# Patient Record
Sex: Female | Born: 1975 | Race: White | Hispanic: No | Marital: Single | Smoking: Never smoker
Health system: Southern US, Community
[De-identification: ages and names within clinical notes are randomized; demographics above are authoritative.]

## PROBLEM LIST (undated history)

## (undated) DIAGNOSIS — I10 Essential (primary) hypertension: Secondary | ICD-10-CM

## (undated) DIAGNOSIS — E7800 Pure hypercholesterolemia, unspecified: Secondary | ICD-10-CM

## (undated) DIAGNOSIS — R079 Chest pain, unspecified: Secondary | ICD-10-CM

## (undated) HISTORY — DX: Essential (primary) hypertension: I10

## (undated) HISTORY — PX: NO PAST SURGERIES: SHX2092

## (undated) HISTORY — DX: Pure hypercholesterolemia, unspecified: E78.00

## (undated) HISTORY — DX: Chest pain, unspecified: R07.9

---

## 2002-07-10 ENCOUNTER — Other Ambulatory Visit: Admission: RE | Admit: 2002-07-10 | Discharge: 2002-07-10 | Payer: Self-pay | Admitting: Family Medicine

## 2004-09-07 ENCOUNTER — Ambulatory Visit: Payer: Self-pay | Admitting: Family Medicine

## 2004-09-16 ENCOUNTER — Other Ambulatory Visit: Admission: RE | Admit: 2004-09-16 | Discharge: 2004-09-16 | Payer: Self-pay | Admitting: Family Medicine

## 2004-09-16 ENCOUNTER — Ambulatory Visit: Payer: Self-pay | Admitting: Family Medicine

## 2010-03-20 ENCOUNTER — Inpatient Hospital Stay (HOSPITAL_COMMUNITY): Payer: Managed Care, Other (non HMO)

## 2010-03-20 ENCOUNTER — Inpatient Hospital Stay (HOSPITAL_COMMUNITY)
Admission: AD | Admit: 2010-03-20 | Discharge: 2010-03-20 | Disposition: A | Discharge status: Home / Self Care | Payer: Managed Care, Other (non HMO) | Source: Ambulatory Visit | Attending: Obstetrics & Gynecology | Admitting: Obstetrics & Gynecology

## 2010-03-20 DIAGNOSIS — R109 Unspecified abdominal pain: Secondary | ICD-10-CM

## 2010-03-20 DIAGNOSIS — O200 Threatened abortion: Secondary | ICD-10-CM | POA: Insufficient documentation

## 2010-03-20 LAB — URINALYSIS, ROUTINE W REFLEX MICROSCOPIC
Bilirubin Urine: NEGATIVE
Leukocytes, UA: NEGATIVE
Nitrite: NEGATIVE
Specific Gravity, Urine: 1.01 (ref 1.005–1.030)
pH: 7 (ref 5.0–8.0)

## 2010-03-20 LAB — CBC
HCT: 41.4 % (ref 36.0–46.0)
Hemoglobin: 14.5 g/dL (ref 12.0–15.0)
MCH: 31 pg (ref 26.0–34.0)
MCHC: 35 g/dL (ref 30.0–36.0)
MCV: 88.7 fL (ref 78.0–100.0)

## 2010-03-20 LAB — URINE MICROSCOPIC-ADD ON

## 2010-03-20 LAB — POCT PREGNANCY, URINE: Preg Test, Ur: POSITIVE

## 2010-03-20 IMAGING — US US OB < 14 WEEKS - US OB TV
1 series · 14 of 28 positions shown · non-contrast
Comparison: None

CLINICAL DATA: 6 weeks 4 days pregnant by last menstrual period
with bleeding.  Beta HCG level 387.

OBSTETRIC <14 WK US AND TRANSVAGINAL OB US
TECHNIQUE: Both transabdominal and transvaginal ultrasound
examinations were performed for complete evaluation of the
gestation as well as the maternal uterus, adnexal regions, and
pelvic cul-de-sac.

[Series 1: us ob < 14 weeks - us ob tv · 14 of 41 slices shown]
[im 2/41]
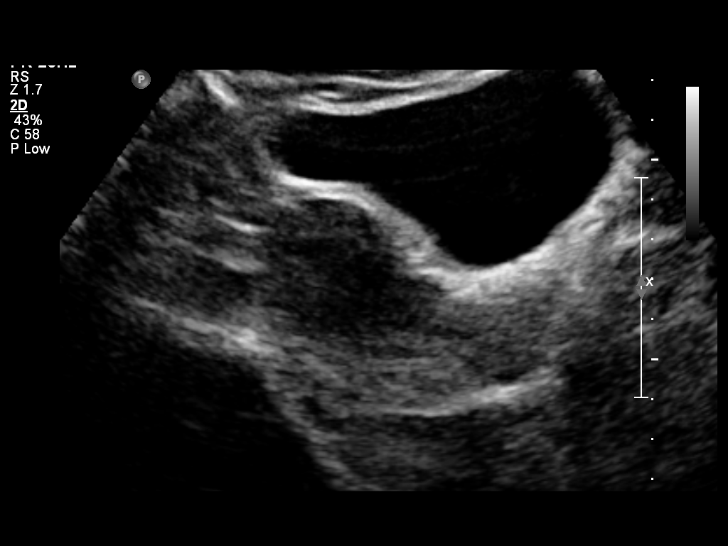
[im 5/41]
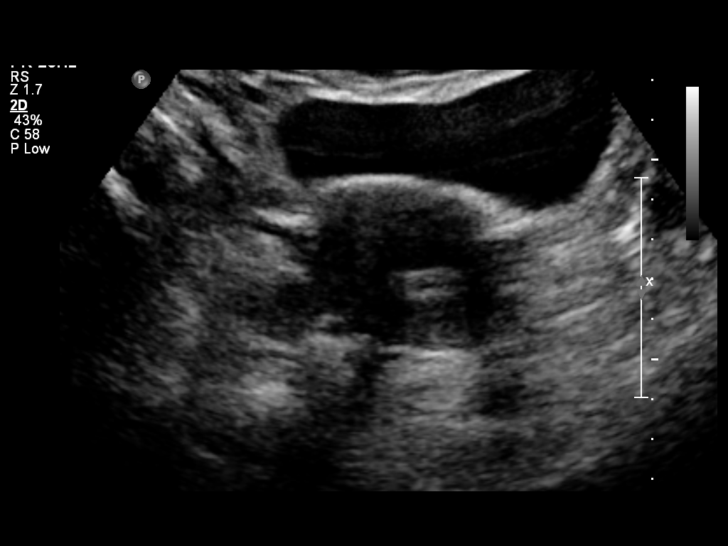
[im 8/41]
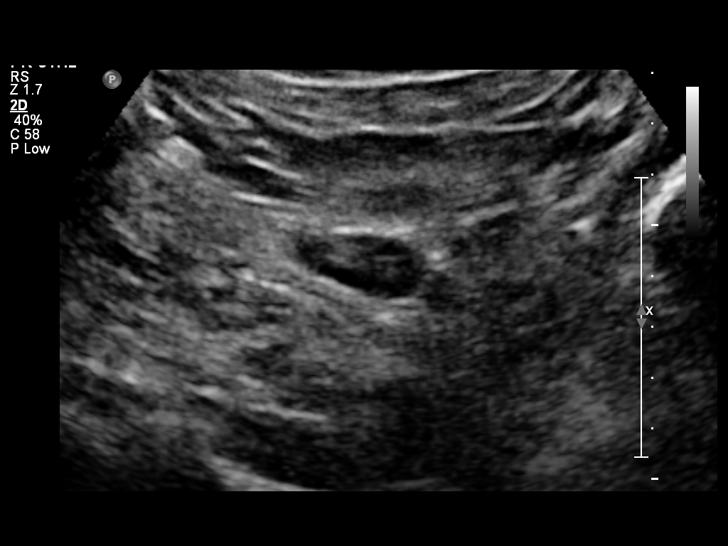
[im 11/41]
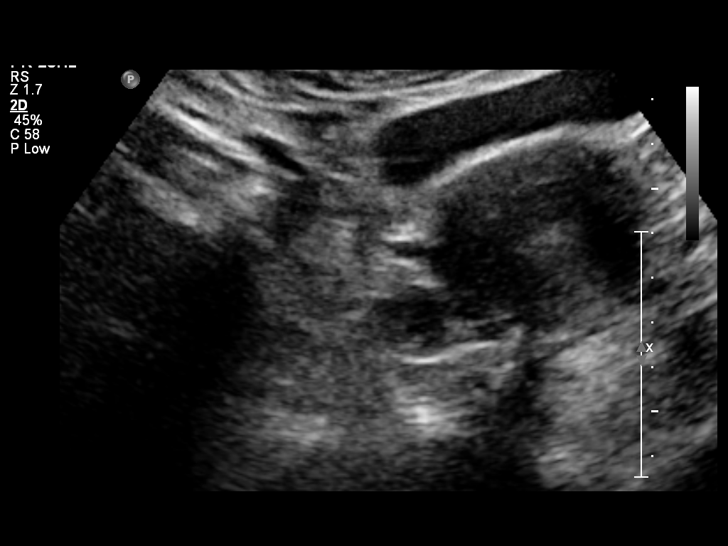
[im 14/41]
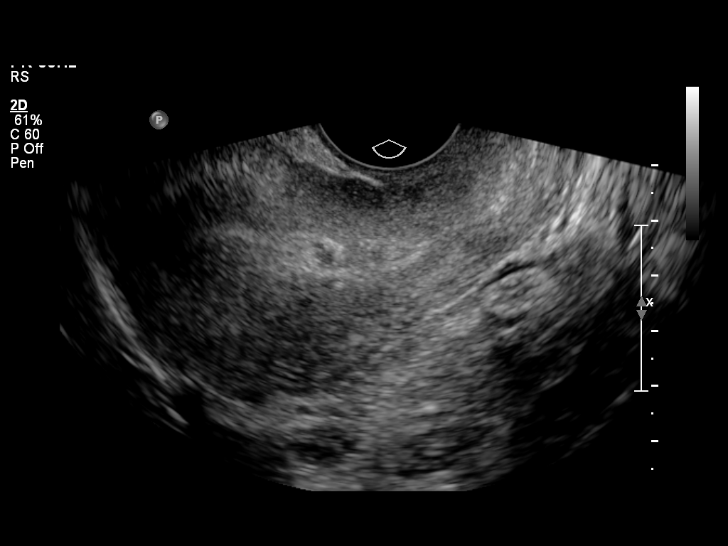
[im 17/41]
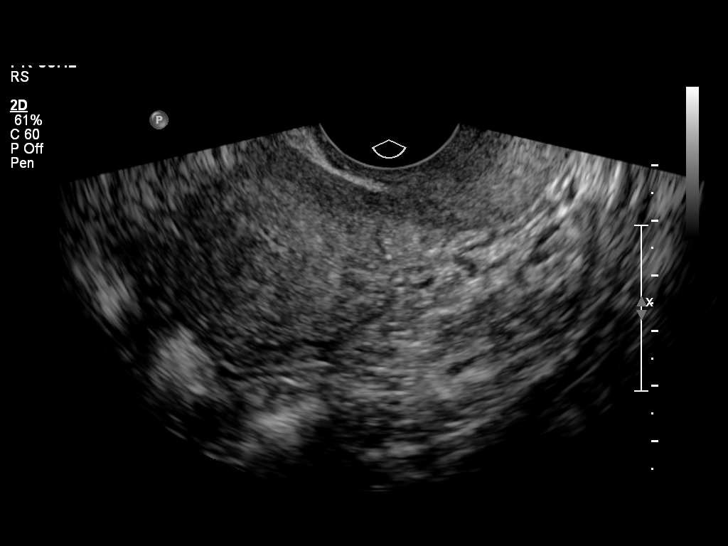
[im 20/41]
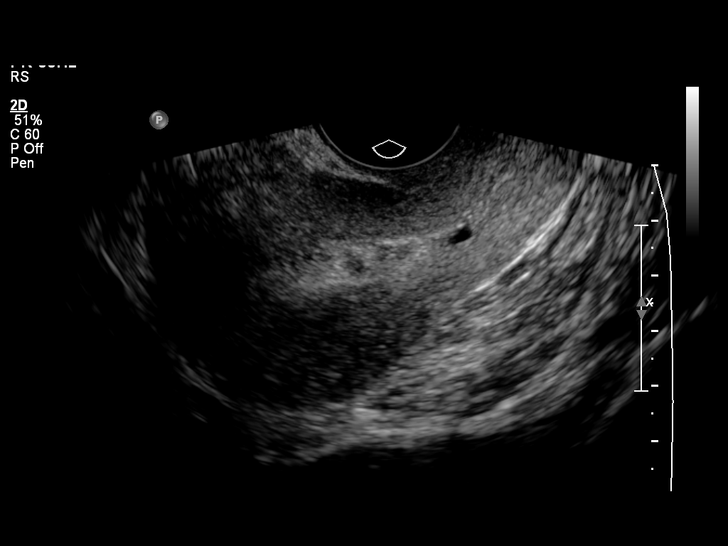
[im 23/41]
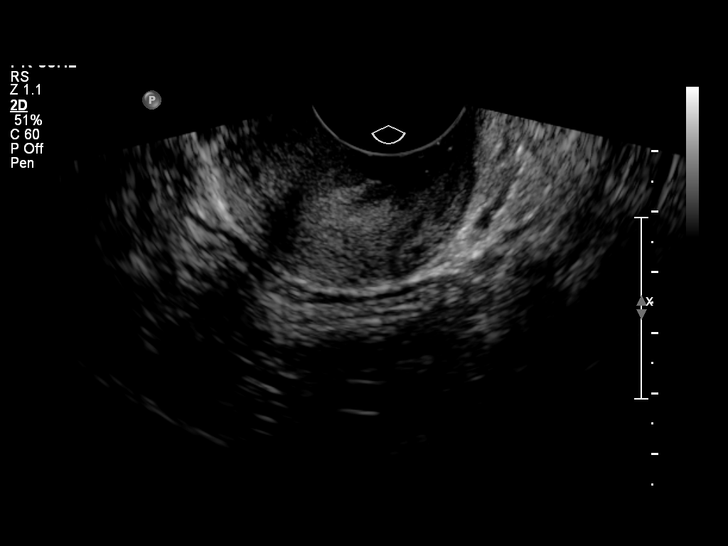
[im 26/41]
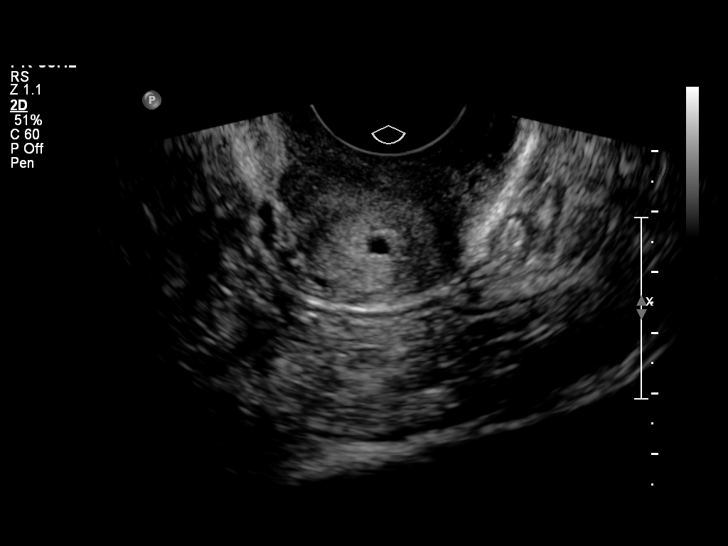
[im 29/41]
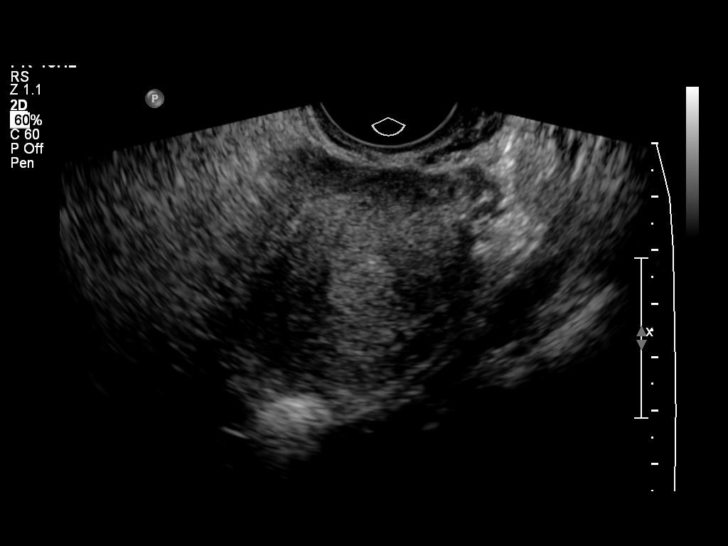
[im 32/41]
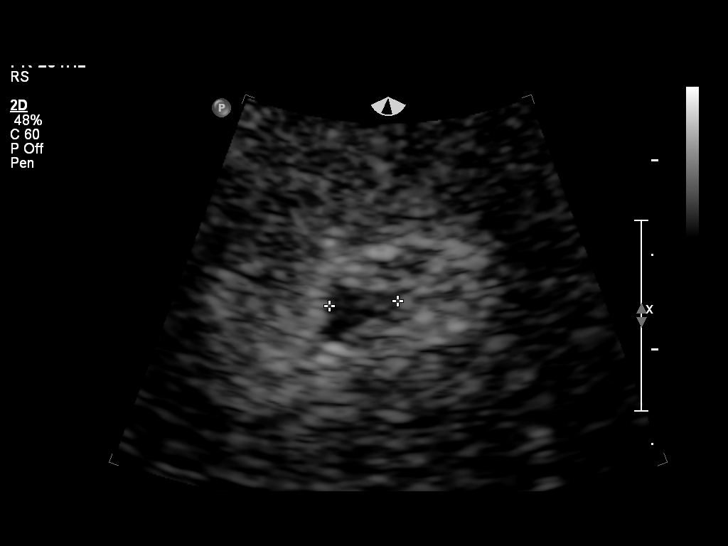
[im 35/41]
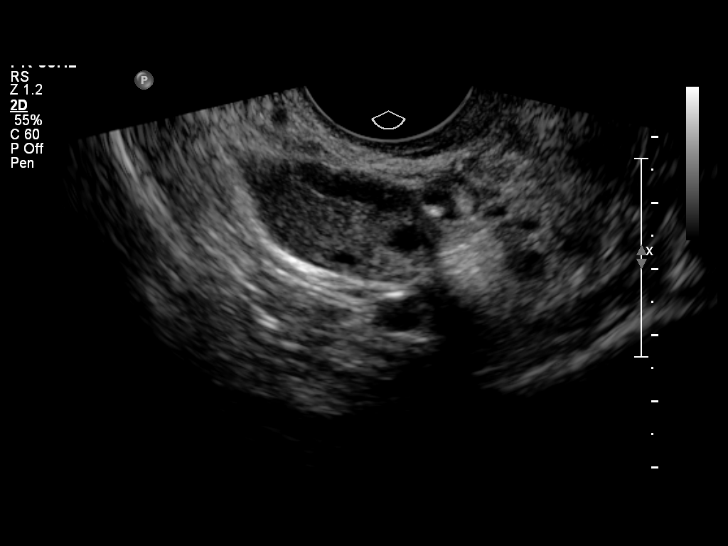
[im 38/41]
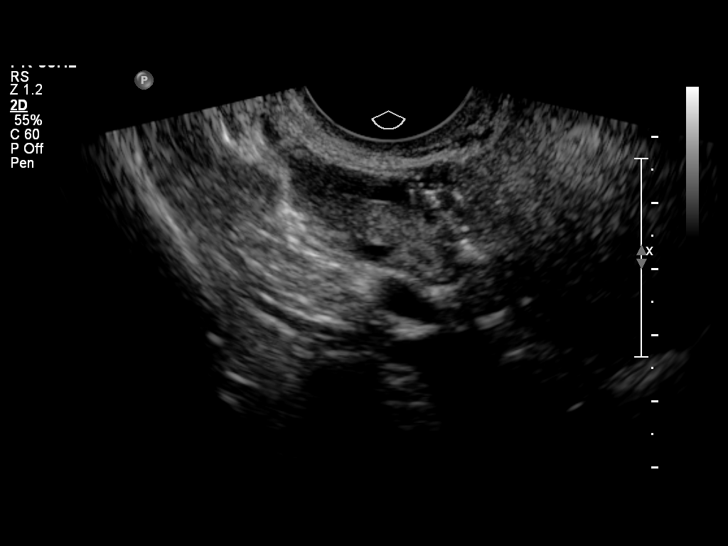
[im 41/41]
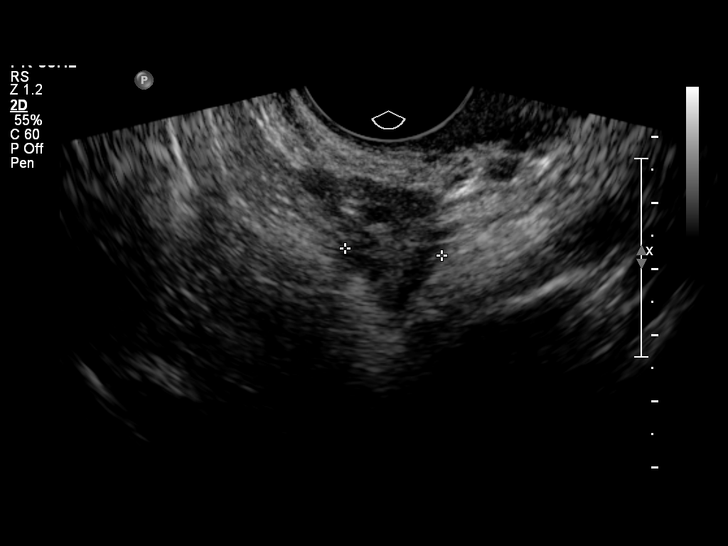

[14 of 28 positions shown; findings below may reference images not displayed]

FINDINGS: The ovaries are normal in appearance.  No adnexal mass.
The right ovary measures 2.5 x 1.2 x 1.8 cm.  The left ovary
measures 2.3 x 1.6 x 1.5 cm.

Within the endometrium, there is heterogeneous complex fluid
collection.  This measures 4 x 4 x 2 mm.  No yolk sac or fetal pole
identified.

No free fluid.
IMPRESSION: Heterogeneous fluid collection within the endometrium is
indeterminate.  Given the low beta HCG level, this could represent
an early intrauterine pregnancy.  Alternately, it could represent a
missed abortion.  Ectopic pregnancy with a "pseudo gestational sac"
cannot be entirely excluded but is felt less likely, given absence
of ovarian or adnexal mass.

## 2010-03-22 ENCOUNTER — Inpatient Hospital Stay (HOSPITAL_COMMUNITY)
Admission: AD | Admit: 2010-03-22 | Discharge: 2010-03-22 | Disposition: A | Discharge status: Home / Self Care | Payer: Managed Care, Other (non HMO) | Source: Ambulatory Visit | Attending: Obstetrics & Gynecology | Admitting: Obstetrics & Gynecology

## 2010-03-22 DIAGNOSIS — O209 Hemorrhage in early pregnancy, unspecified: Secondary | ICD-10-CM

## 2010-03-22 LAB — HCG, QUANTITATIVE, PREGNANCY: hCG, Beta Chain, Quant, S: 39 m[IU]/mL — ABNORMAL HIGH (ref <5)

## 2010-03-29 ENCOUNTER — Inpatient Hospital Stay (HOSPITAL_COMMUNITY)
Admission: AD | Admit: 2010-03-29 | Discharge: 2010-03-29 | Disposition: A | Discharge status: Home / Self Care | Payer: Managed Care, Other (non HMO) | Source: Ambulatory Visit | Attending: Obstetrics and Gynecology | Admitting: Obstetrics and Gynecology

## 2010-03-29 DIAGNOSIS — O039 Complete or unspecified spontaneous abortion without complication: Secondary | ICD-10-CM

## 2010-03-29 LAB — HCG, QUANTITATIVE, PREGNANCY: hCG, Beta Chain, Quant, S: 3 m[IU]/mL (ref <5)

## 2012-05-01 ENCOUNTER — Encounter (HOSPITAL_COMMUNITY): Payer: Self-pay | Admitting: Adult Health

## 2012-05-01 ENCOUNTER — Emergency Department (HOSPITAL_COMMUNITY)
Admission: EM | Admit: 2012-05-01 | Discharge: 2012-05-02 | Disposition: A | Discharge status: Home / Self Care | Payer: Managed Care, Other (non HMO) | Attending: Emergency Medicine | Admitting: Emergency Medicine

## 2012-05-01 DIAGNOSIS — R209 Unspecified disturbances of skin sensation: Secondary | ICD-10-CM | POA: Insufficient documentation

## 2012-05-01 DIAGNOSIS — R202 Paresthesia of skin: Secondary | ICD-10-CM

## 2012-05-01 DIAGNOSIS — R110 Nausea: Secondary | ICD-10-CM | POA: Insufficient documentation

## 2012-05-01 DIAGNOSIS — Z7982 Long term (current) use of aspirin: Secondary | ICD-10-CM | POA: Insufficient documentation

## 2012-05-01 NOTE — ED Notes (Signed)
Presents with left arm numbness and pain that began at 11 am today. Reports arm numbness from elbow to fingertips. Denies chest pain, denies shoulder pain, denies SOB. Pt states, "my body just has not felt right today" denies dizziness. Pain began while at work today while at rest.

## 2012-05-02 ENCOUNTER — Emergency Department (HOSPITAL_COMMUNITY): Payer: Managed Care, Other (non HMO)

## 2012-05-02 ENCOUNTER — Encounter (HOSPITAL_COMMUNITY): Payer: Self-pay | Admitting: Radiology

## 2012-05-02 DIAGNOSIS — R209 Unspecified disturbances of skin sensation: Secondary | ICD-10-CM

## 2012-05-02 LAB — CBC WITH DIFFERENTIAL/PLATELET
Basophils Relative: 1 % (ref 0–1)
Hemoglobin: 14.9 g/dL (ref 12.0–15.0)
Lymphs Abs: 3.3 10*3/uL (ref 0.7–4.0)
MCHC: 36.4 g/dL — ABNORMAL HIGH (ref 30.0–36.0)
Monocytes Relative: 5 % (ref 3–12)
Neutro Abs: 10.5 10*3/uL — ABNORMAL HIGH (ref 1.7–7.7)
Neutrophils Relative %: 71 % (ref 43–77)
RBC: 4.73 MIL/uL (ref 3.87–5.11)
WBC: 14.9 10*3/uL — ABNORMAL HIGH (ref 4.0–10.5)

## 2012-05-02 LAB — POCT I-STAT, CHEM 8
Chloride: 104 mEq/L (ref 96–112)
Glucose, Bld: 111 mg/dL — ABNORMAL HIGH (ref 70–99)
HCT: 42 % (ref 36.0–46.0)
Potassium: 3.6 mEq/L (ref 3.5–5.1)

## 2012-05-02 MED ORDER — GADOBENATE DIMEGLUMINE 529 MG/ML IV SOLN
16.0000 mL | Freq: Once | INTRAVENOUS | Status: AC
  Administered 2012-05-02: 16 mL via INTRAVENOUS

## 2012-05-02 NOTE — Consult Note (Signed)
NEURO HOSPITALIST CONSULT NOTE    Reason for Consult:left forearm paresthesias.  HPI:                                                                                                                                          Norma Ross is an 37 y.o. 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 Some tingling sensation involving left hand fingers. Never had similar symptoms before. Denies weakness or pain left upper extremity. In addition, there is not numbness-tingling of the left face and left lower extremity. No associated headache, vertigo, double vision, difficulty swallowing, focal weakness, slurred speech, dysequilibrium, language or visual disturbances. No bladder or bowel impairment. Denies neck discomfort or recent head-neck trauma. CT brain without contrast reported as " focal lucency in the right basal ganglia which could represent prominent perivascular space or lacunar infarct".     History reviewed. No pertinent past medical history.  History reviewed. No pertinent past surgical history.  History reviewed. No pertinent family history.  Family History: no contributory.   Social History:  reports that she has never smoked. She does not have any smokeless tobacco history on file. She reports that she does not drink alcohol or use illicit drugs.  No Known Allergies  MEDICATIONS:                                                                                                                     I have reviewed the patient's current medications.   ROS:  History obtained from the patient.  General ROS: negative for -  chills, fatigue, fever, night sweats, weight gain or weight loss Psychological ROS: negative for - behavioral disorder, hallucinations, memory difficulties, mood swings or suicidal ideation Ophthalmic ROS: negative for - blurry vision, double vision, eye pain or loss of vision ENT ROS: negative for - epistaxis, nasal discharge, oral lesions, sore throat, tinnitus or vertigo Allergy and Immunology ROS: negative for - hives or itchy/watery eyes Hematological and Lymphatic ROS: negative for - bleeding problems, bruising or swollen lymph nodes Endocrine ROS: negative for - galactorrhea, hair pattern changes, polydipsia/polyuria or temperature intolerance Respiratory ROS: negative for - cough, hemoptysis, shortness of breath or wheezing Cardiovascular ROS: negative for - chest pain, dyspnea on exertion, edema or irregular heartbeat Gastrointestinal ROS: negative for - abdominal pain, diarrhea, hematemesis, nausea/vomiting or stool incontinence Genito-Urinary ROS: negative for - dysuria, hematuria, incontinence or urinary frequency/urgency Musculoskeletal ROS: negative for - joint swelling or muscular weakness Neurological ROS: as noted in HPI Dermatological ROS: negative for rash and skin lesion changes     Physical exam: pleasant female in no apparent distress. Head: normocephalic. Neck: supple, no bruits, no JVD. Cardiac: no murmurs. Lungs: clear. Abdomen: soft, no tender, no mass. Extremities: no edema.    Neurologic Examination:                                                                                                      Mental Status: Alert, awake, oriented x 4, thought content appropriate.  Comprehension, naming, and repetition intact. Speech fluent without evidence of aphasia.  Able to follow 3 step commands without difficulty. Cranial Nerves: II: Discs flat bilaterally; Visual fields grossly normal, pupils equal, round, reactive to light and accommodation III,IV, VI: ptosis  not present, extra-ocular motions intact bilaterally V,VII: smile symmetric, facial light touch sensation normal bilaterally VIII: hearing normal bilaterally IX,X: gag reflex present XI: bilateral shoulder shrug XII: midline tongue extension Motor: Right : Upper extremity   5/5    Left:     Upper extremity   5/5  Lower extremity   5/5     Lower extremity   5/5 Tone and bulk:normal tone throughout; no atrophy noted Sensory: Pinprick and light touch intact throughout, bilaterally Deep Tendon Reflexes: 2+ and symmetric throughout Plantars: Right: downgoing   Left: downgoing Cerebellar: normal finger-to-nose,  normal heel-to-shin test Gait:  No ataxia. CV: pulses palpable throughout    No results found for this basename: cbc, bmp, coags, chol, tri, ldl, hga1c    Results for orders placed during the hospital encounter of 05/01/12 (from the past 48 hour(s))  CBC WITH DIFFERENTIAL     Status: Abnormal   Collection Time    05/02/12 12:20 AM      Result Value Range   WBC 14.9 (*) 4.0 - 10.5 K/uL   RBC 4.73  3.87 - 5.11 MIL/uL   Hemoglobin 14.9  12.0 - 15.0 g/dL   HCT 13.0  86.5 - 78.4 %   MCV 86.5  78.0 - 100.0 fL   MCH 31.5  26.0 - 34.0  pg   MCHC 36.4 (*) 30.0 - 36.0 g/dL   RDW 16.1  09.6 - 04.5 %   Platelets 283  150 - 400 K/uL   Neutrophils Relative 71  43 - 77 %   Neutro Abs 10.5 (*) 1.7 - 7.7 K/uL   Lymphocytes Relative 22  12 - 46 %   Lymphs Abs 3.3  0.7 - 4.0 K/uL   Monocytes Relative 5  3 - 12 %   Monocytes Absolute 0.8  0.1 - 1.0 K/uL   Eosinophils Relative 2  0 - 5 %   Eosinophils Absolute 0.2  0.0 - 0.7 K/uL   Basophils Relative 1  0 - 1 %   Basophils Absolute 0.1  0.0 - 0.1 K/uL  POCT I-STAT TROPONIN I     Status: None   Collection Time    05/02/12 12:30 AM      Result Value Range   Troponin i, poc 0.00  0.00 - 0.08 ng/mL   Comment 3            Comment: Due to the release kinetics of cTnI,     a negative result within the first hours     of the onset of  symptoms does not rule out     myocardial infarction with certainty.     If myocardial infarction is still suspected,     repeat the test at appropriate intervals.  POCT I-STAT, CHEM 8     Status: Abnormal   Collection Time    05/02/12 12:33 AM      Result Value Range   Sodium 140  135 - 145 mEq/L   Potassium 3.6  3.5 - 5.1 mEq/L   Chloride 104  96 - 112 mEq/L   BUN 7  6 - 23 mg/dL   Creatinine, Ser 4.09  0.50 - 1.10 mg/dL   Glucose, Bld 811 (*) 70 - 99 mg/dL   Calcium, Ion 9.14  7.82 - 1.23 mmol/L   TCO2 29  0 - 100 mmol/L   Hemoglobin 14.3  12.0 - 15.0 g/dL   HCT 95.6  21.3 - 08.6 %    Ct Head Wo Contrast  05/02/2012  *RADIOLOGY REPORT*  Clinical Data: Left arm numbness for 1 day.  No trauma.  CT HEAD WITHOUT CONTRAST  Technique:  Contiguous axial images were obtained from the base of the skull through the vertex without contrast.  Comparison: None.  Findings: There is a focal lucency in the right basal ganglia which could represent prominent perivascular space or lacunar infarct. Age is indeterminate.  Intracranial contents are otherwise unremarkable.  There is no mass effect or midline shift.  No abnormal extra-axial fluid collections.  Gray-white matter junctions are distinct.  Basal cisterns are not effaced. Ventricles are not dilated.  No evidence of acute intracranial hemorrhage.  No depressed skull fractures.  Visualized mastoid air cells are not opacified.  There is mucosal membrane thickening noted throughout the paranasal sinuses without acute air-fluid level, likely due to chronic inflammatory process.  IMPRESSION: Focal lucency in the right basal ganglia which could represent prominent perivascular space or lacunar infarct.  Intracranial contents are otherwise unremarkable.  No acute intracranial hemorrhage or mass effect.   Original Report Authenticated By: Burman Nieves, M.D.      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 CT showed  a small focal lucency in the right basal ganglia, unclear if this represents a prominent perivascular space or an age indeterminate lacunar infarct. Of note, she has no risk factors for stroke. She is neurologically intact on exam, and her sensory complains are subjective. However, can not dismiss the CT findings and thus will suggest obtaining brain MRI-DWI for further clarification of previously described CT findings. She can be discharge home if MRI normal and have outpatient neurology follow to pursue further testing if needed.  Wyatt Portela, MD [facility] [phone]  05/02/2012, 2:23 AM

## 2012-05-02 NOTE — ED Notes (Signed)
Pt transported to MRI 

## 2012-05-02 NOTE — ED Notes (Signed)
Pt remains in MRI at this time  

## 2012-05-02 NOTE — ED Notes (Signed)
Pt returned from MRI °

## 2012-05-02 NOTE — ED Provider Notes (Signed)
History     CSN: [id number]  Arrival date & time 05/01/12  2314   First MD Initiated Contact with Patient 05/02/12 0001      Chief Complaint  Patient presents with  . Arm Pain    (Consider location/radiation/quality/duration/timing/severity/associated sxs/prior treatment) HPI This is a 37 year old female with a vague sense of discomfort in her left forearm since 11 AM. The onset was fairly sudden. She denies actual pain. She describes it as a numbness and a tightness although her sensation remains intact. There is no motor deficit. She denies any other neurologic complaint. Nothing makes the symptoms better or worse. She denies headache, visual change, hearing change, vomiting, diarrhea, chest pain, shortness of breath, diaphoresis or lightheadedness. She has had some mild nausea today and a general sense that "something is wrong with me". She is having no neck pain or upper arm pain or discomfort.  History reviewed. No pertinent past medical history.  History reviewed. No pertinent past surgical history.  History reviewed. No pertinent family history.  History  Substance Use Topics  . Smoking status: Never Smoker   . Smokeless tobacco: Not on file  . Alcohol Use: No    OB History   Grav Para Term Preterm Abortions TAB SAB Ect Mult Living                  Review of Systems  All other systems reviewed and are negative.    Allergies  Review of patient's allergies indicates no known allergies.  Home Medications   Current Outpatient Rx  Name  Route  Sig  Dispense  Refill  . aspirin 81 MG chewable tablet   Oral   Chew 81 mg by mouth daily.           BP 166/75  Pulse 101  Temp(Src) 97.9 F (36.6 C) (Oral)  Resp 18  SpO2 96%  Physical Exam General: Well-developed, well-nourished female in no acute distress; appearance consistent with age of record HENT: normocephalic, atraumatic Eyes: pupils equal round and reactive to light; extraocular muscles  intact Neck: supple Heart: regular rate and rhythm Lungs: clear to auscultation bilaterally Abdomen: soft; nondistended Extremities: No deformity; full range of motion; pulses normal; no tenderness; brisk capillary refill in fingers bilaterally Neurologic: Awake, alert and oriented; motor function intact in all extremities and symmetric; no facial droop; sensation intact and symmetric in upper extremities; normal coordination speech; negative Romberg; normal finger to nose Skin: Warm and dry Psychiatric: Normal mood and affect    ED Course  Procedures (including critical care time)     MDM   Nursing notes and vitals signs, including pulse oximetry, reviewed.  Summary of this visit's results, reviewed by myself:  Labs:  Results for orders placed during the hospital encounter of 05/01/12 (from the past 24 hour(s))  CBC WITH DIFFERENTIAL     Status: Abnormal   Collection Time    05/02/12 12:20 AM      Result Value Range   WBC 14.9 (*) 4.0 - 10.5 K/uL   RBC 4.73  3.87 - 5.11 MIL/uL   Hemoglobin 14.9  12.0 - 15.0 g/dL   HCT 82.9  56.2 - 13.0 %   MCV 86.5  78.0 - 100.0 fL   MCH 31.5  26.0 - 34.0 pg   MCHC 36.4 (*) 30.0 - 36.0 g/dL   RDW 86.5  78.4 - 69.6 %   Platelets 283  150 - 400 K/uL   Neutrophils Relative 71  43 - 77 %  Neutro Abs 10.5 (*) 1.7 - 7.7 K/uL   Lymphocytes Relative 22  12 - 46 %   Lymphs Abs 3.3  0.7 - 4.0 K/uL   Monocytes Relative 5  3 - 12 %   Monocytes Absolute 0.8  0.1 - 1.0 K/uL   Eosinophils Relative 2  0 - 5 %   Eosinophils Absolute 0.2  0.0 - 0.7 K/uL   Basophils Relative 1  0 - 1 %   Basophils Absolute 0.1  0.0 - 0.1 K/uL  POCT I-STAT TROPONIN I     Status: None   Collection Time    05/02/12 12:30 AM      Result Value Range   Troponin i, poc 0.00  0.00 - 0.08 ng/mL   Comment 3           POCT I-STAT, CHEM 8     Status: Abnormal   Collection Time    05/02/12 12:33 AM      Result Value Range   Sodium 140  135 - 145 mEq/L   Potassium 3.6   3.5 - 5.1 mEq/L   Chloride 104  96 - 112 mEq/L   BUN 7  6 - 23 mg/dL   Creatinine, Ser 1.61  0.50 - 1.10 mg/dL   Glucose, Bld 096 (*) 70 - 99 mg/dL   Calcium, Ion 0.45  4.09 - 1.23 mmol/L   TCO2 29  0 - 100 mmol/L   Hemoglobin 14.3  12.0 - 15.0 g/dL   HCT 81.1  91.4 - 78.2 %    Imaging Studies: Ct Head Wo Contrast  05/02/2012  *RADIOLOGY REPORT*  Clinical Data: Left arm numbness for 1 day.  No trauma.  CT HEAD WITHOUT CONTRAST  Technique:  Contiguous axial images were obtained from the base of the skull through the vertex without contrast.  Comparison: None.  Findings: There is a focal lucency in the right basal ganglia which could represent prominent perivascular space or lacunar infarct. Age is indeterminate.  Intracranial contents are otherwise unremarkable.  There is no mass effect or midline shift.  No abnormal extra-axial fluid collections.  Gray-white matter junctions are distinct.  Basal cisterns are not effaced. Ventricles are not dilated.  No evidence of acute intracranial hemorrhage.  No depressed skull fractures.  Visualized mastoid air cells are not opacified.  There is mucosal membrane thickening noted throughout the paranasal sinuses without acute air-fluid level, likely due to chronic inflammatory process.  IMPRESSION: Focal lucency in the right basal ganglia which could represent prominent perivascular space or lacunar infarct.  Intracranial contents are otherwise unremarkable.  No acute intracranial hemorrhage or mass effect.   Original Report Authenticated By: Burman Nieves, M.D.        Date: 05/01/2012 11:24 PM  Rate: 100  Rhythm: normal sinus rhythm  QRS Axis: normal  Intervals: normal  ST/T Wave abnormalities: normal  Conduction Disutrbances: none  Narrative Interpretation: unremarkable  Comparison with previous EKG: None available  7:38 AM Dr. Chestine Spore of radiology has reviewed MRI and finds no acute infarct.        Hanley Seamen, MD 05/02/12 [phone]

## 2015-06-16 DIAGNOSIS — R202 Paresthesia of skin: Secondary | ICD-10-CM

## 2015-06-16 DIAGNOSIS — F419 Anxiety disorder, unspecified: Secondary | ICD-10-CM | POA: Insufficient documentation

## 2015-06-16 DIAGNOSIS — I10 Essential (primary) hypertension: Secondary | ICD-10-CM

## 2015-06-16 DIAGNOSIS — E785 Hyperlipidemia, unspecified: Secondary | ICD-10-CM | POA: Insufficient documentation

## 2015-06-16 DIAGNOSIS — G43109 Migraine with aura, not intractable, without status migrainosus: Secondary | ICD-10-CM | POA: Insufficient documentation

## 2015-06-16 HISTORY — DX: Anxiety disorder, unspecified: F41.9

## 2015-06-16 HISTORY — DX: Essential (primary) hypertension: I10

## 2015-06-16 HISTORY — DX: Hyperlipidemia, unspecified: E78.5

## 2015-06-16 HISTORY — DX: Migraine with aura, not intractable, without status migrainosus: G43.109

## 2015-06-16 HISTORY — DX: Paresthesia of skin: R20.2

## 2015-06-17 DIAGNOSIS — F410 Panic disorder [episodic paroxysmal anxiety] without agoraphobia: Secondary | ICD-10-CM

## 2015-06-17 HISTORY — DX: Panic disorder (episodic paroxysmal anxiety): F41.0

## 2016-12-22 DIAGNOSIS — Z0000 Encounter for general adult medical examination without abnormal findings: Secondary | ICD-10-CM

## 2016-12-22 HISTORY — DX: Encounter for general adult medical examination without abnormal findings: Z00.00

## 2017-05-15 DIAGNOSIS — J0100 Acute maxillary sinusitis, unspecified: Secondary | ICD-10-CM

## 2017-05-15 DIAGNOSIS — J029 Acute pharyngitis, unspecified: Secondary | ICD-10-CM | POA: Insufficient documentation

## 2017-05-15 HISTORY — DX: Acute maxillary sinusitis, unspecified: J01.00

## 2017-05-15 HISTORY — DX: Acute pharyngitis, unspecified: J02.9

## 2017-10-02 DIAGNOSIS — N6320 Unspecified lump in the left breast, unspecified quadrant: Secondary | ICD-10-CM

## 2017-10-02 HISTORY — DX: Unspecified lump in the left breast, unspecified quadrant: N63.20

## 2019-07-17 ENCOUNTER — Telehealth: Payer: Self-pay

## 2019-07-17 NOTE — Telephone Encounter (Signed)
Patients needs an appointment for hospital follow up. She was seen in the hospital by Dr. Servando Salina but can see any of the providers.   Called to schedule appointment and voicemail was full.

## 2019-07-29 ENCOUNTER — Other Ambulatory Visit: Payer: Self-pay

## 2019-07-31 ENCOUNTER — Other Ambulatory Visit: Payer: Self-pay

## 2019-07-31 ENCOUNTER — Ambulatory Visit: Payer: No Typology Code available for payment source | Admitting: Cardiology

## 2019-07-31 VITALS — BP 144/92 | HR 83 | Ht <= 58 in | Wt 217.0 lb

## 2019-07-31 DIAGNOSIS — I10 Essential (primary) hypertension: Secondary | ICD-10-CM

## 2019-07-31 DIAGNOSIS — I209 Angina pectoris, unspecified: Secondary | ICD-10-CM | POA: Insufficient documentation

## 2019-07-31 DIAGNOSIS — Z3200 Encounter for pregnancy test, result unknown: Secondary | ICD-10-CM

## 2019-07-31 DIAGNOSIS — I208 Other forms of angina pectoris: Secondary | ICD-10-CM

## 2019-07-31 DIAGNOSIS — E782 Mixed hyperlipidemia: Secondary | ICD-10-CM | POA: Diagnosis not present

## 2019-07-31 DIAGNOSIS — Z8249 Family history of ischemic heart disease and other diseases of the circulatory system: Secondary | ICD-10-CM

## 2019-07-31 HISTORY — DX: Angina pectoris, unspecified: I20.9

## 2019-07-31 HISTORY — DX: Family history of ischemic heart disease and other diseases of the circulatory system: Z82.49

## 2019-07-31 HISTORY — DX: Morbid (severe) obesity due to excess calories: E66.01

## 2019-07-31 MED ORDER — METOPROLOL TARTRATE 100 MG PO TABS: 100.0000 mg | ORAL_TABLET | Freq: Once | ORAL | 0 refills | Status: DC

## 2019-07-31 MED ORDER — NITROGLYCERIN 0.4 MG SL SUBL: 0.4000 mg | SUBLINGUAL_TABLET | SUBLINGUAL | 6 refills | Status: DC | PRN

## 2019-07-31 NOTE — Patient Instructions (Addendum)
Medication Instructions:  Your physician has recommended you make the following change in your medication:   Take Nitroglycerin as needed for chest pain.  *If you need a refill on your cardiac medications before your next appointment, please call your pharmacy*   Lab Work: Your physician recommends that you return for lab work in: a few days prior to your CT you need to come and get a pregnancy test and BMET. No appointment is needed. If you have labs (blood work) drawn today and your tests are completely normal, you will receive your results only by: Marland Kitchen MyChart Message (if you have MyChart) OR . A paper copy in the mail If you have any lab test that is abnormal or we need to change your treatment, we will call you to review the results.   Testing/Procedures: Your cardiac CT will be scheduled at:   [facility] [address] [place], [place] [address] [phone]  [facility], please arrive at the [facility] main entrance of [facility] 30 minutes prior to test start time. Proceed to the [facility] Radiology Department (first floor) to check-in and test prep.  Please follow these instructions carefully (unless otherwise directed):   On the Night Before the Test: . Be sure to Drink plenty of water. . Do not consume any caffeinated/decaffeinated beverages or chocolate 12 hours prior to your test. . Do not take any antihistamines 12 hours prior to your test.   On the Day of the Test: . Drink plenty of water. Do not drink any water within one hour of the test. . Do not eat any food 4 hours prior to the test. . You may take your regular medications prior to the test.  . Take metoprolol (Lopressor) two hours prior to test. . FEMALES- please wear underwire-free bra if available       After the Test: . Drink plenty of water. . After receiving IV contrast, you may experience a mild flushed feeling. This is normal. . On occasion, you may experience  a mild rash up to 24 hours after the test. This is not dangerous. If this occurs, you can take Benadryl 25 mg and increase your fluid intake. . If you experience trouble breathing, this can be serious. If it is severe call 911 IMMEDIATELY. If it is mild, please call our office. . If you take any of these medications: Glipizide/Metformin, Avandament, Glucavance, please do not take 48 hours after completing test unless otherwise instructed.   Once we have confirmed authorization from your insurance company, we will call you to set up a date and time for your test.   For non-scheduling related questions, please contact the cardiac imaging nurse navigator should you have any questions/concerns: Marchia Bond, Cardiac Imaging Nurse Navigator Burley Saver, Interim Cardiac Imaging Nurse [place] [facility] Direct Office Dial: [phone]   For scheduling needs, including cancellations and rescheduling, please call [phone].      Follow-Up: At [facility], you and your health needs are our priority.  As part of our continuing mission to provide you with exceptional heart care, we have created designated Provider Care Teams.  These Care Teams include your primary Cardiologist (physician) and Advanced Practice Providers (APPs -  Physician Assistants and Nurse Practitioners) who all work together to provide you with the care you need, when you need it.  We recommend signing up for the patient portal called "MyChart".  Sign up information is provided on this  After Visit Summary.  MyChart is used to connect with patients for Virtual Visits (Telemedicine).  Patients are able to view lab/test results, encounter notes, upcoming appointments, etc.  Non-urgent messages can be sent to your provider as well.   To learn more about what you can do with MyChart, go to [email or web address].    Your next appointment:   2 month(s)  The format for your next appointment:   In  Person  Provider:   Jyl Heinz, MD   Other Instructions NA

## 2019-07-31 NOTE — Addendum Note (Signed)
Addended by: Eleonore Chiquito on: 07/31/2019 11:01 AM   Modules accepted: Orders

## 2019-07-31 NOTE — Progress Notes (Signed)
Cardiology Office Note:    Date:  07/31/2019   ID:  [name], DOB Oct 23, 1975, MRN [id number]  PCP:  Leane Call, PA-C  Cardiologist:  Garwin Brothers, MD   Referring MD: No ref. provider found    ASSESSMENT:    1. Essential (primary) hypertension   2. Mixed hyperlipidemia   3. Morbid obesity (HCC)   4. Angina pectoris (HCC)   5. Family history of coronary artery disease    PLAN:    In order of problems listed above:  1. Angina pectoris: Patient's symptoms are significant.  In view of risk factors I am concerned and I discussed with invasive and noninvasive evaluations and she prefers CT coronary angiography with FFR and I will arrange for this.  I also mentioned to her about pregnancy and she is very sure that she is not pregnant.  I told her we will do a urine pregnancy test before this test.  Sublingual nitroglycerin prescription was sent, its protocol and 911 protocol explained and the patient vocalized understanding questions were answered to the patient's satisfaction.  She knows to go to the nearest emergency room for any concerning symptoms. 2. Essential hypertension: Blood pressure stable and diet was emphasized 3. Mixed dyslipidemia and obesity: Diet was emphasized weight reduction was stressed.  Risks of obesity explained and she plans to do better. 4. Family history of coronary artery disease: Therefore we will do an extensive evaluation at this time and make sure we address her coronary status 5. Patient will be seen in follow-up appointment in 2 months or earlier if the patient has any concerns    Medication Adjustments/Labs and Tests Ordered: Current medicines are reviewed at length with the patient today.  Concerns regarding medicines are outlined above.  No orders of the defined types were placed in this encounter.  No orders of the defined types were placed in this encounter.    History of Present Illness:    Norma Ross is a 44 y.o. female  who is being seen today for the evaluation of chest pain at the request of primary care in the emergency room.  Patient is a pleasant 44 year old female.  She has past medical history of essential hypertension and anxiety.  She is family history of coronary artery disease.  She has history of mixed dyslipidemia.  She is obese with a BMI of over 45 and leads a sedentary lifestyle.  She mentions to me that she had some chest tightness radiating to the left arm the other day.  For this reason she went to the emergency room.  She was treated and released.  Subsequently she has done fine.  Currently no chest pain orthopnea or PND.  At the time of my evaluation, the patient is alert awake oriented and in no distress.  She mentions to me that when she exerts herself she has no such symptoms.  She does not exercise regularly.  She is sexually active.  With this she denies any problems with chest pain.  Past Medical History:  Diagnosis Date  . Acute non-recurrent maxillary sinusitis 05/15/2017  . Anxiety 06/16/2015  . Breast mass, left 10/02/2017  . Chest pain   . 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 Essential (primary) hypertension   . Hypercholesterolemia   . Hyperlipidemia 06/16/2015  . Panic attacks 06/17/2015  . Paresthesia of arm 06/16/2015  . Paresthesia of left leg 06/16/2015  . Sorethroat 05/15/2017  . Wellness examination 12/22/2016    History reviewed. No pertinent  surgical history.  Current Medications: Current Meds  Medication Sig  . albuterol (VENTOLIN HFA) 108 (90 Base) MCG/ACT inhaler Inhale 2 puffs into the lungs every 6 (six) hours as needed.  Marland Kitchen amLODipine (NORVASC) 2.5 MG tablet Take 2.5 mg by mouth daily.  Marland Kitchen aspirin 81 MG chewable tablet Chew 81 mg by mouth daily.  . diazepam (VALIUM) 10 MG tablet Take 0.5 tablets by mouth every 8 (eight) hours as needed. for panic attacks.  . DULoxetine (CYMBALTA) 30 MG capsule Take 1 capsule by mouth daily.  . fluticasone (FLONASE) 50 MCG/ACT nasal spray Place  2 sprays into the nose daily.  . ondansetron (ZOFRAN) 4 MG tablet Take 1 tablet by mouth as needed.     Allergies:   Patient has no known allergies.   Social History   Socioeconomic History  . Marital status: Single    Spouse name: Not on file  . Number of children: Not on file  . Years of education: Not on file  . Highest education level: Not on file  Occupational History  . Not on file  Tobacco Use  . Smoking status: Never Smoker  . Smokeless tobacco: Never Used  Substance and Sexual Activity  . Alcohol use: No  . Drug use: No  . Sexual activity: Not on file  Other Topics Concern  . Not on file  Social History Narrative  . Not on file   Social Determinants of Health   Financial Resource Strain:   . Difficulty of Paying Living Expenses:   Food Insecurity:   . Worried About Charity fundraiser in the Last Year:   . Arboriculturist in the Last Year:   Transportation Needs:   . Film/video editor (Medical):   Marland Kitchen Lack of Transportation (Non-Medical):   Physical Activity:   . Days of Exercise per Week:   . Minutes of Exercise per Session:   Stress:   . Feeling of Stress :   Social Connections:   . Frequency of Communication with Friends and Family:   . Frequency of Social Gatherings with Friends and Family:   . Attends Religious Services:   . Active Member of Clubs or Organizations:   . Attends Archivist Meetings:   Marland Kitchen Marital Status:      Family History: The patient's family history is not on file.  ROS:   Please see the history of present illness.    All other systems reviewed and are negative.  EKGs/Labs/Other Studies Reviewed:    The following studies were reviewed today: EKG reveals sinus rhythm and nonspecific ST-T changes   Recent Labs: No results found for requested labs within last 8760 hours.  Recent Lipid Panel No results found for: CHOL, TRIG, HDL, CHOLHDL, VLDL, LDLCALC, LDLDIRECT  Physical Exam:    VS:  BP (!) 144/92    Pulse 83   Ht 4\' 10"  (1.473 m)   Wt 217 lb (98.4 kg)   SpO2 97%   BMI 45.35 kg/m     Wt Readings from Last 3 Encounters:  07/31/19 217 lb (98.4 kg)  05/02/12 175 lb (79.4 kg)     GEN: Patient is in no acute distress HEENT: Normal NECK: No JVD; No carotid bruits LYMPHATICS: No lymphadenopathy CARDIAC: S1 S2 regular, 2/6 systolic murmur at the apex. RESPIRATORY:  Clear to auscultation without rales, wheezing or rhonchi  ABDOMEN: Soft, non-tender, non-distended MUSCULOSKELETAL:  No edema; No deformity  SKIN: Warm and dry NEUROLOGIC:  Alert and oriented  x 3 PSYCHIATRIC:  Normal affect    Signed, Garwin Brothers, MD  07/31/2019 10:33 AM    [facility]

## 2019-08-09 DIAGNOSIS — R079 Chest pain, unspecified: Secondary | ICD-10-CM | POA: Insufficient documentation

## 2019-09-18 ENCOUNTER — Telehealth (HOSPITAL_COMMUNITY): Payer: Self-pay | Admitting: *Deleted

## 2019-09-18 NOTE — Telephone Encounter (Signed)
Attempted to call patient regarding upcoming cardiac CT appointment. The voicemail was full and unable to leave a message.  Will attempt to reach pt again.  Burley Saver RN Navigator Cardiac Imaging [facility] [phone] Office [phone] Cell

## 2019-09-19 ENCOUNTER — Ambulatory Visit (HOSPITAL_COMMUNITY): Payer: No Typology Code available for payment source

## 2019-09-19 ENCOUNTER — Telehealth (HOSPITAL_COMMUNITY): Payer: Self-pay | Admitting: *Deleted

## 2019-09-19 NOTE — Telephone Encounter (Signed)
Pt returning call regarding upcoming cardiac imaging study; pt verbalizes understanding of appt date/time, parking situation and where to check in, pre-test NPO status and medications ordered, and verified current allergies; name and call back number provided for further questions should they arise  Liviah Cake Tai RN Navigator Cardiac Imaging [facility] [phone] office [phone] cell  

## 2019-09-20 ENCOUNTER — Encounter (HOSPITAL_COMMUNITY): Payer: Self-pay

## 2019-09-20 ENCOUNTER — Ambulatory Visit (HOSPITAL_COMMUNITY)
Admission: RE | Admit: 2019-09-20 | Discharge: 2019-09-20 | Disposition: A | Discharge status: Home / Self Care | Payer: No Typology Code available for payment source | Source: Ambulatory Visit | Attending: Cardiology | Admitting: Cardiology

## 2019-09-20 ENCOUNTER — Encounter: Payer: No Typology Code available for payment source | Admitting: *Deleted

## 2019-09-20 ENCOUNTER — Other Ambulatory Visit: Payer: Self-pay

## 2019-09-20 DIAGNOSIS — Z8249 Family history of ischemic heart disease and other diseases of the circulatory system: Secondary | ICD-10-CM | POA: Insufficient documentation

## 2019-09-20 DIAGNOSIS — I2510 Atherosclerotic heart disease of native coronary artery without angina pectoris: Secondary | ICD-10-CM

## 2019-09-20 DIAGNOSIS — I208 Other forms of angina pectoris: Secondary | ICD-10-CM

## 2019-09-20 DIAGNOSIS — I209 Angina pectoris, unspecified: Secondary | ICD-10-CM

## 2019-09-20 DIAGNOSIS — Z006 Encounter for examination for normal comparison and control in clinical research program: Secondary | ICD-10-CM

## 2019-09-20 MED ORDER — NITROGLYCERIN 0.4 MG SL SUBL
SUBLINGUAL_TABLET | SUBLINGUAL | Status: AC
  Administered 2019-09-20: 0.8 mg via SUBLINGUAL
  Filled 2019-09-20: qty 2

## 2019-09-20 MED ORDER — NITROGLYCERIN 0.4 MG SL SUBL
0.8000 mg | SUBLINGUAL_TABLET | Freq: Once | SUBLINGUAL | Status: AC
Start: 2019-09-20 — End: 2019-09-20

## 2019-09-20 MED ORDER — IOHEXOL 350 MG/ML SOLN
80.0000 mL | Freq: Once | INTRAVENOUS | Status: AC | PRN
  Administered 2019-09-20: 80 mL via INTRAVENOUS

## 2019-09-20 NOTE — Research (Signed)
Subject Name: Norma Ross  Subject met inclusion and exclusion criteria.  The informed consent form, study requirements and expectations were reviewed with the subject and questions and concerns were addressed prior to the signing of the consent form.  The subject verbalized understanding of the trial requirements.  The subject agreed to participate in the CADFEM trial and signed the informed consent at [place] on 09/20/19  The informed consent was obtained prior to performance of any protocol-specific procedures for the subject.  A copy of the signed informed consent was given to the subject and a copy was placed in the subject's medical record.   Star Age Adrian

## 2019-09-23 DIAGNOSIS — I208 Other forms of angina pectoris: Secondary | ICD-10-CM | POA: Diagnosis not present

## 2019-09-27 ENCOUNTER — Ambulatory Visit (INDEPENDENT_AMBULATORY_CARE_PROVIDER_SITE_OTHER): Payer: No Typology Code available for payment source | Admitting: Cardiology

## 2019-09-27 ENCOUNTER — Ambulatory Visit: Payer: No Typology Code available for payment source | Admitting: Cardiology

## 2019-09-27 ENCOUNTER — Other Ambulatory Visit: Payer: Self-pay

## 2019-09-27 ENCOUNTER — Encounter: Payer: Self-pay | Admitting: Cardiology

## 2019-09-27 VITALS — BP 142/90 | HR 82 | Ht <= 58 in | Wt 214.2 lb

## 2019-09-27 DIAGNOSIS — I2510 Atherosclerotic heart disease of native coronary artery without angina pectoris: Secondary | ICD-10-CM

## 2019-09-27 DIAGNOSIS — Z8249 Family history of ischemic heart disease and other diseases of the circulatory system: Secondary | ICD-10-CM | POA: Diagnosis not present

## 2019-09-27 DIAGNOSIS — E782 Mixed hyperlipidemia: Secondary | ICD-10-CM

## 2019-09-27 DIAGNOSIS — I10 Essential (primary) hypertension: Secondary | ICD-10-CM | POA: Diagnosis not present

## 2019-09-27 HISTORY — DX: Atherosclerotic heart disease of native coronary artery without angina pectoris: I25.10

## 2019-09-27 NOTE — Progress Notes (Signed)
Cardiology Office Note:    Date:  09/27/2019   ID:  [name], DOB 1975/12/05, MRN [id number]  PCP:  Leane Call, PA-C  Cardiologist:  Garwin Brothers, MD   Referring MD: Leane Call, PA-C    ASSESSMENT:    1. Essential (primary) hypertension   2. Coronary artery disease involving native coronary artery of native heart without angina pectoris   3. Mixed hyperlipidemia   4. Family history of coronary artery disease   5. Morbid obesity (HCC)    PLAN:    In order of problems listed above:  1. Coronary artery disease: Secondary prevention stressed with the patient.  Importance of compliance with diet medication stressed and she vocalized understanding. 2. Essential hypertension: Blood pressure is stable.  She is a little anxious today because of the results of her CT scan.  She will keep to monitor it and get back to me about this. 3. Mixed dyslipidemia: She is now on a statin therapy for a month or so and in 2 weeks she will be back for liver lipid check and we will monitor it closely.  She will take aspirin coated 81 mg daily. 4. Sublingual nitroglycerin prescription was sent, its protocol and 911 protocol explained and the patient vocalized understanding questions were answered to the patient's satisfaction 5. Morbid obesity: Diet was emphasized.  Weight reduction was stressed.  I explained to her the importance of graded exercise and she is going to start on a regular basis to in a slow and steady manner. 6. Patient will be seen in follow-up appointment in 6 months or earlier if the patient has any concerns    Medication Adjustments/Labs and Tests Ordered: Current medicines are reviewed at length with the patient today.  Concerns regarding medicines are outlined above.  No orders of the defined types were placed in this encounter.  No orders of the defined types were placed in this encounter.    No chief complaint on file.    History of Present Illness:     Norma Ross is a 44 y.o. female.  Patient has past medical history of essential hypertension mixed dyslipidemia and obesity.  She leads a sedentary lifestyle.  She mentions to me that she is very concerned about the findings of her CT scan.  Currently she leads a sedentary lifestyle and has no symptoms.  At the time of my evaluation, the patient is alert awake oriented and in no distress.  Past Medical History:  Diagnosis Date  . Acute non-recurrent maxillary sinusitis 05/15/2017  . Anxiety 06/16/2015  . Breast mass, left 10/02/2017  . Chest pain   . 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 Essential (primary) hypertension   . Hypercholesterolemia   . Hyperlipidemia 06/16/2015  . Panic attacks 06/17/2015  . Paresthesia of arm 06/16/2015  . Paresthesia of left leg 06/16/2015  . Sorethroat 05/15/2017  . Wellness examination 12/22/2016    Past Surgical History:  Procedure Laterality Date  . NO PAST SURGERIES      Current Medications: Current Meds  Medication Sig  . albuterol (VENTOLIN HFA) 108 (90 Base) MCG/ACT inhaler Inhale 2 puffs into the lungs every 6 (six) hours as needed.  Marland Kitchen amLODipine (NORVASC) 2.5 MG tablet Take 2.5 mg by mouth daily.  Marland Kitchen aspirin 81 MG chewable tablet Chew 81 mg by mouth daily.  Marland Kitchen atorvastatin (LIPITOR) 20 MG tablet Take 20 mg by mouth daily.  . diazepam (VALIUM) 10 MG tablet Take 0.5 tablets by mouth every  8 (eight) hours as needed. for panic attacks.  . DULoxetine (CYMBALTA) 30 MG capsule Take 1 capsule by mouth daily.  . fluticasone (FLONASE) 50 MCG/ACT nasal spray Place 2 sprays into the nose daily.  . nitroGLYCERIN (NITROSTAT) 0.4 MG SL tablet Place 1 tablet (0.4 mg total) under the tongue every 5 (five) minutes as needed.  . ondansetron (ZOFRAN) 4 MG tablet Take 1 tablet by mouth as needed.     Allergies:   Patient has no known allergies.   Social History   Socioeconomic History  . Marital status: Single    Spouse name: Not on file  . Number of children: Not  on file  . Years of education: Not on file  . Highest education level: Not on file  Occupational History  . Not on file  Tobacco Use  . Smoking status: Never Smoker  . Smokeless tobacco: Never Used  Substance and Sexual Activity  . Alcohol use: No  . Drug use: No  . Sexual activity: Not on file  Other Topics Concern  . Not on file  Social History Narrative  . Not on file   Social Determinants of Health   Financial Resource Strain:   . Difficulty of Paying Living Expenses:   Food Insecurity:   . Worried About Programme researcher, broadcasting/film/video in the Last Year:   . Barista in the Last Year:   Transportation Needs:   . Freight forwarder (Medical):   Marland Kitchen Lack of Transportation (Non-Medical):   Physical Activity:   . Days of Exercise per Week:   . Minutes of Exercise per Session:   Stress:   . Feeling of Stress :   Social Connections:   . Frequency of Communication with Friends and Family:   . Frequency of Social Gatherings with Friends and Family:   . Attends Religious Services:   . Active Member of Clubs or Organizations:   . Attends Banker Meetings:   Marland Kitchen Marital Status:      Family History: The patient's family history includes Diabetes in her maternal grandfather; Heart disease in her mother.  ROS:   Please see the history of present illness.    All other systems reviewed and are negative.  EKGs/Labs/Other Studies Reviewed:    The following studies were reviewed today: CT CORONARY FRACTIONAL FLOW RESERVE DATA PREP (Accession [id number]) (Order [id number]) Imaging Date: 09/20/2019 Department: [facility] CT IMAGING Released By: Bryna Colander, RT Authorizing: Jyssica Rief, Aundra Dubin, MD  Exam Status  Status  Final [99]  Study Result  Narrative & Impression  CLINICAL DATA:  Chest pain  EXAM: CT FFR  MEDICATIONS: No additional medications  TECHNIQUE: The coronary CT was sent for FFR.  FINDINGS: FFR 0.8 mid RCA  FFR 0.82  mid LAD  IMPRESSION: Mid RCA stenosis is of borderline hemodynamic significance. Mid LAD stenosis is probably not hemodynamically significant.  Norma Ross   Electronically Signed   By: Marca Ancona M.D.   On: 09/23/2019 17:46      Recent Labs: No results found for requested labs within last 8760 hours.  Recent Lipid Panel No results found for: CHOL, TRIG, HDL, CHOLHDL, VLDL, LDLCALC, LDLDIRECT  Physical Exam:    VS:  BP (!) 142/90   Pulse 82   Ht 4\' 10"  (1.473 m)   Wt 214 lb 3.7 oz (97.2 kg)   SpO2 96%   BMI 44.77 kg/m     Wt Readings from Last 3 Encounters:  09/27/19 214 lb 3.7 oz (97.2 kg)  07/31/19 217 lb (98.4 kg)  05/02/12 175 lb (79.4 kg)     GEN: Patient is in no acute distress HEENT: Normal NECK: No JVD; No carotid bruits LYMPHATICS: No lymphadenopathy CARDIAC: Hear sounds regular, 2/6 systolic murmur at the apex. RESPIRATORY:  Clear to auscultation without rales, wheezing or rhonchi  ABDOMEN: Soft, non-tender, non-distended MUSCULOSKELETAL:  No edema; No deformity  SKIN: Warm and dry NEUROLOGIC:  Alert and oriented x 3 PSYCHIATRIC:  Normal affect   Signed, Garwin Brothers, MD  09/27/2019 12:07 PM    [facility]

## 2019-09-27 NOTE — Patient Instructions (Signed)
Medication Instructions:  No medication changes. *If you need a refill on your cardiac medications before your next appointment, please call your pharmacy*   Lab Work: Your physician recommends that you return for lab work in: 2 weeks (10/11/19).  You need to have labs done when you are fasting.  You can come Monday through Friday 8:30 am to 12:00 pm and 1:15 to 4:30. You do not need to make an appointment as the order has already been placed. The labs you are going to have done are BMET, LFT and Lipids.   If you have labs (blood work) drawn today and your tests are completely normal, you will receive your results only by:  MyChart Message (if you have MyChart) OR  A paper copy in the mail If you have any lab test that is abnormal or we need to change your treatment, we will call you to review the results.   Testing/Procedures: None ordered   Follow-Up: At [facility], you and your health needs are our priority.  As part of our continuing mission to provide you with exceptional heart care, we have created designated Provider Care Teams.  These Care Teams include your primary Cardiologist (physician) and Advanced Practice Providers (APPs -  Physician Assistants and Nurse Practitioners) who all work together to provide you with the care you need, when you need it.  We recommend signing up for the patient portal called "MyChart".  Sign up information is provided on this After Visit Summary.  MyChart is used to connect with patients for Virtual Visits (Telemedicine).  Patients are able to view lab/test results, encounter notes, upcoming appointments, etc.  Non-urgent messages can be sent to your provider as well.   To learn more about what you can do with MyChart, go to [email or web address].    Your next appointment:   3 month(s)  The format for your next appointment:   In Person  Provider:   Belva Crome, MD   Other Instructions NA

## 2019-11-24 DIAGNOSIS — I2510 Atherosclerotic heart disease of native coronary artery without angina pectoris: Secondary | ICD-10-CM

## 2019-11-24 HISTORY — DX: Atherosclerotic heart disease of native coronary artery without angina pectoris: I25.10

## 2020-01-01 ENCOUNTER — Ambulatory Visit: Payer: No Typology Code available for payment source | Admitting: Cardiology

## 2020-01-02 ENCOUNTER — Other Ambulatory Visit: Payer: Self-pay

## 2020-01-02 DIAGNOSIS — E7800 Pure hypercholesterolemia, unspecified: Secondary | ICD-10-CM | POA: Insufficient documentation

## 2020-01-03 ENCOUNTER — Ambulatory Visit: Payer: No Typology Code available for payment source | Admitting: Cardiology

## 2020-02-24 ENCOUNTER — Other Ambulatory Visit: Payer: Self-pay | Admitting: Cardiology

## 2020-03-10 ENCOUNTER — Other Ambulatory Visit: Payer: Self-pay | Admitting: Cardiology
